# Patient Record
Sex: Male | Born: 2000 | Race: Black or African American | Hispanic: No | Smoking: Never smoker
Health system: Southern US, Community
[De-identification: ages and names within clinical notes are randomized; demographics above are authoritative.]

---
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
## 2021-03-27 ENCOUNTER — Ambulatory Visit (HOSPITAL_BASED_OUTPATIENT_CLINIC_OR_DEPARTMENT_OTHER): Payer: BC Managed Care – PPO | Admitting: Nurse Practitioner

## 2021-04-26 ENCOUNTER — Encounter (HOSPITAL_BASED_OUTPATIENT_CLINIC_OR_DEPARTMENT_OTHER): Payer: Self-pay | Admitting: Nurse Practitioner
